# Patient Record
Sex: Male | Born: 1945 | Race: White | Hispanic: No | Marital: Married
Health system: Southern US, Community
[De-identification: ages and names within clinical notes are randomized; demographics above are authoritative.]

## PROBLEM LIST (undated history)

## (undated) DIAGNOSIS — I421 Obstructive hypertrophic cardiomyopathy: Secondary | ICD-10-CM

## (undated) DIAGNOSIS — K5190 Ulcerative colitis, unspecified, without complications: Secondary | ICD-10-CM

## (undated) DIAGNOSIS — I10 Essential (primary) hypertension: Secondary | ICD-10-CM

## (undated) DIAGNOSIS — R55 Syncope and collapse: Secondary | ICD-10-CM

## (undated) DIAGNOSIS — I2510 Atherosclerotic heart disease of native coronary artery without angina pectoris: Secondary | ICD-10-CM

## (undated) HISTORY — PX: APPENDECTOMY: SHX54

## (undated) HISTORY — DX: Obstructive hypertrophic cardiomyopathy: I42.1

## (undated) HISTORY — DX: Essential (primary) hypertension: I10

## (undated) HISTORY — PX: CARDIAC DEFIBRILLATOR PLACEMENT: SHX171

## (undated) HISTORY — PX: CHOLECYSTECTOMY: SHX55

## (undated) HISTORY — DX: Ulcerative colitis, unspecified, without complications: K51.90

## (undated) HISTORY — DX: Atherosclerotic heart disease of native coronary artery without angina pectoris: I25.10

## (undated) HISTORY — PX: ROTATOR CUFF REPAIR: SHX139

## (undated) HISTORY — DX: Syncope and collapse: R55

---

## 1971-08-12 HISTORY — PX: ILEOSTOMY: SHX1783

## 1971-08-12 HISTORY — PX: TOTAL COLECTOMY: SHX852

## 1986-08-11 HISTORY — PX: SMALL INTESTINE SURGERY: SHX150

## 2001-12-17 ENCOUNTER — Ambulatory Visit (HOSPITAL_COMMUNITY): Admission: RE | Admit: 2001-12-17 | Discharge: 2001-12-18 | Payer: Self-pay | Admitting: Internal Medicine

## 2001-12-18 ENCOUNTER — Encounter: Payer: Self-pay | Admitting: Internal Medicine

## 2004-09-05 ENCOUNTER — Ambulatory Visit: Payer: Self-pay | Admitting: Internal Medicine

## 2009-12-25 DIAGNOSIS — K5190 Ulcerative colitis, unspecified, without complications: Secondary | ICD-10-CM | POA: Insufficient documentation

## 2009-12-25 DIAGNOSIS — Z8669 Personal history of other diseases of the nervous system and sense organs: Secondary | ICD-10-CM

## 2009-12-25 DIAGNOSIS — I2510 Atherosclerotic heart disease of native coronary artery without angina pectoris: Secondary | ICD-10-CM | POA: Insufficient documentation

## 2009-12-25 DIAGNOSIS — I10 Essential (primary) hypertension: Secondary | ICD-10-CM | POA: Insufficient documentation

## 2009-12-26 ENCOUNTER — Ambulatory Visit: Payer: Self-pay | Admitting: Internal Medicine

## 2009-12-26 DIAGNOSIS — Z95810 Presence of automatic (implantable) cardiac defibrillator: Secondary | ICD-10-CM | POA: Insufficient documentation

## 2009-12-26 DIAGNOSIS — I424 Endocardial fibroelastosis: Secondary | ICD-10-CM | POA: Insufficient documentation

## 2009-12-26 DIAGNOSIS — R55 Syncope and collapse: Secondary | ICD-10-CM | POA: Insufficient documentation

## 2009-12-27 ENCOUNTER — Encounter: Payer: Self-pay | Admitting: Internal Medicine

## 2009-12-31 ENCOUNTER — Encounter: Payer: Self-pay | Admitting: Internal Medicine

## 2009-12-31 ENCOUNTER — Ambulatory Visit (HOSPITAL_COMMUNITY): Admission: RE | Admit: 2009-12-31 | Discharge: 2009-12-31 | Payer: Self-pay | Admitting: Internal Medicine

## 2010-01-01 LAB — CONVERTED CEMR LAB
BUN: 20 mg/dL (ref 6–23)
Basophils Absolute: 0 10*3/uL (ref 0.0–0.1)
Basophils Relative: 0 % (ref 0–1)
CO2: 23 meq/L (ref 19–32)
Calcium: 9.3 mg/dL (ref 8.4–10.5)
Chloride: 101 meq/L (ref 96–112)
Creatinine, Ser: 1.15 mg/dL (ref 0.40–1.50)
Eosinophils Absolute: 0.3 10*3/uL (ref 0.0–0.7)
Eosinophils Relative: 3 % (ref 0–5)
Glucose, Bld: 126 mg/dL — ABNORMAL HIGH (ref 70–99)
HCT: 43.1 % (ref 39.0–52.0)
Hemoglobin: 14.8 g/dL (ref 13.0–17.0)
INR: 1.27 (ref <1.50)
Lymphocytes Relative: 18 % (ref 12–46)
Lymphs Abs: 1.9 10*3/uL (ref 0.7–4.0)
MCHC: 34.3 g/dL (ref 30.0–36.0)
MCV: 93.5 fL (ref 78.0–100.0)
Monocytes Absolute: 1 10*3/uL (ref 0.1–1.0)
Monocytes Relative: 10 % (ref 3–12)
Neutro Abs: 7 10*3/uL (ref 1.7–7.7)
Neutrophils Relative %: 69 % (ref 43–77)
Platelets: 194 10*3/uL (ref 150–400)
Potassium: 4.1 meq/L (ref 3.5–5.3)
Prothrombin Time: 15.8 s — ABNORMAL HIGH (ref 11.6–15.2)
RBC: 4.61 M/uL (ref 4.22–5.81)
RDW: 14.2 % (ref 11.5–15.5)
Sodium: 135 meq/L (ref 135–145)
WBC: 10.2 10*3/uL (ref 4.0–10.5)
aPTT: 35 s (ref 24–37)

## 2010-01-04 ENCOUNTER — Ambulatory Visit: Payer: Self-pay | Admitting: Internal Medicine

## 2010-01-04 ENCOUNTER — Ambulatory Visit (HOSPITAL_COMMUNITY): Admission: RE | Admit: 2010-01-04 | Discharge: 2010-01-04 | Payer: Self-pay | Admitting: Internal Medicine

## 2010-01-11 ENCOUNTER — Encounter: Payer: Self-pay | Admitting: Internal Medicine

## 2010-01-21 ENCOUNTER — Ambulatory Visit: Payer: Self-pay

## 2010-01-21 ENCOUNTER — Encounter: Payer: Self-pay | Admitting: Internal Medicine

## 2010-04-11 ENCOUNTER — Ambulatory Visit: Payer: Self-pay | Admitting: Internal Medicine

## 2010-07-12 ENCOUNTER — Encounter: Payer: Self-pay | Admitting: Internal Medicine

## 2010-09-10 NOTE — Miscellaneous (Signed)
Summary: Device preload  Clinical Lists Changes  Observations: Added new observation of ICD INDICATN: CIM (01/11/2010 7:51) Added new observation of ICDLEADSTAT1: active (01/11/2010 7:51) Added new observation of ICDLEADSER1: [id number]  (01/11/2010 7:51) Added new observation of ICDLEADMOD1: 0158  (01/11/2010 7:51) Added new observation of ICDLEADLOC1: RV  (01/11/2010 7:51) Added new observation of ICD IMP MD: Lewayne Bunting, MD  (01/11/2010 7:51) Added new observation of ICDLEADDOI1: 01/04/2010  (01/11/2010 7:51) Added new observation of ICD IMPL DTE: 01/04/2010  (01/11/2010 7:51) Added new observation of ICD SERL#: [id number]  (01/11/2010 7:51) Added new observation of ICD MODL#: E102  (01/11/2010 7:51) Added new observation of ICDMANUFACTR: [employer]  (01/11/2010 7:51) Added new observation of ICD MD: Lewayne Bunting, MD  (01/11/2010 7:51)       ICD Specifications Following MD:  Lewayne Bunting, MD     ICD Vendor:  [facility] [employer]     ICD Model Number:  E102     ICD Serial Number:  [id number] ICD DOI:  01/04/2010     ICD Implanting MD:  Lewayne Bunting, MD  Lead 1:    Location: RV     DOI: 01/04/2010     Model #: [id number]     Serial #: [id number]     Status: active  Indications::  CIM   Huston Foley Parameters Mode VVIR     Lower Rate Limit:  40      Tachy Zones VF:  210     VT:  170

## 2010-09-10 NOTE — Cardiovascular Report (Signed)
Summary: Office Visit   Office Visit   Imported By: Roderic Ovens 01/11/2010 10:34:28  _____________________________________________________________________  External Attachment:    Type:   Image     Comment:   External Document

## 2010-09-10 NOTE — Letter (Signed)
Summary: Implantable Device Instructions  [facility] [place]  [address] [place] [address]   Phone: [phone]  Fax: [phone]      Implantable Device Instructions  You are scheduled for:  _____ Permanent Transvenous Pacemaker _____ Implantable Cardioverter Defibrillator _____ Implantable Loop Recorder __X___ Generator Change  on __May 27, 2011___ with Dr. __Taylor___.  1.  Please arrive at the Short Stay Center at [facility] at __12:30pm___ on the day of your procedure.  2.  Do not eat or drink the night before your procedure.  3.  Complete lab work on _May 23, 2011____.    4.  Do NOT take these medications for ____ days prior to your procedure:  _________________________.  Take your last dose of Coumadin on ________.  5.  Plan for an overnight stay.  Bring your insurance cards and a list of your medications.  6.  Wash your chest and neck with antibacterial soap (any brand) the evening before and the morning of your procedure.  Rinse well.  7.  Education material received:     Pacemaker _____           ICD _____           Arrhythmia _____  *If you have ANY questions after you get home, please call the office [phone].  *Every attempt is made to prevent procedures from being rescheduled.  Due to the nauture of Electrophysiology, rescheduling can happen.  The physician is always aware and directs the staff when this occurs.

## 2010-09-10 NOTE — Assessment & Plan Note (Signed)
Summary: defib check.gdt.amber   Visit Type:  Follow-up Primary Provider:  DR.GARY MILLER  CC:  NO CARDIOLOGY COMPLAINTS TODAY.  History of Present Illness: [name] returns today for followup.  We have not seen him for many years.  He has a h/o HCM and syncope and underwent ICD implant 8 yrs ago.  He has noticed that over the past two weeks he has heard his ICD beeping.  He denies any intercurrent ICD therapies and has not had any syncopal episodes.  No other complaints today.  Current Medications (verified): 1)  Metoprolol Tartrate 25 Mg Tabs (Metoprolol Tartrate) .... Take 1 Tab Daily 2)  Zantac 150 Mg Tabs (Ranitidine Hcl) .... Take 1 Tab Two Times A Day 3)  Paxil 10 Mg Tabs (Paroxetine Hcl) .... Take 1 Tab Daily 4)  Aspir-Low 81 Mg Tbec (Aspirin) .... Take 1 Tab Daily 5)  Lopid 600 Mg Tabs (Gemfibrozil) .... Take 1 Tab Daily 6)  Zantac 150 Mg Tabs (Ranitidine Hcl) .... Take 1 Tab Two Times A Day 7)  Paxil 20 Mg Tabs (Paroxetine Hcl) .... Take 1 Tab Daily 8)  Ambien 10 Mg Tabs (Zolpidem Tartrate) .... Take 1 Tab Daily 9)  Aspir-Low 81 Mg Tbec (Aspirin) .... Take 1 Tab Daily 10)  Daily Multi  Tabs (Multiple Vitamins-Minerals) .... Take 1 Tab Daily 11)  Folic Acid 1 Mg Tabs (Folic Acid) .... Take 1 Tab Daily  Allergies (verified): 1)  ! 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    Vital Signs:  Patient profile:   65 year old male Height:      68  inches Weight:      131 pounds BMI:     19.99 Pulse rate:   51 / minute BP sitting:   99 / 63  (right arm)  Vitals Entered By: Dreama Saa, CNA (Dec 26, 2009 4:13 PM)  Physical Exam  General:  Well developed, well nourished, in no acute distress.  HEENT: normal Neck: supple. No JVD. Carotids 2+ bilaterally no bruits Cor: RRR no rubs, gallops or murmur Lungs: CTA Ab: soft, nontender. nondistended. No HSM. Good bowel sounds. Scaphoid. Ext: warm. no cyanosis, clubbing or edema Neuro: alert and oriented. Grossly nonfocal. affect pleasant     ICD Follow Up Remote Check?  No Battery Voltage:  2.50 V     Charge Time:  14.6 seconds     Battery Est. Longevity:  ERI Underlying rhythm:  SR   ICD Device Measurements Right Ventricle:  Amplitude: 15 mV, Impedance: 511 ohms, Threshold: 0.8 V at 0.5 msec Shock Impedance: 52 ohms   Episodes Shock:  0     ATP:  0     Nonsustained:  0     Ventricular Pacing:  1%  Brady Parameters Mode VVIR     Lower Rate Limit:  40      Tachy Zones VF:  210     VT:  170     Tech Comments:  Battery @ [facility] 5/11.   Altha Harm, LPN  Dec 26, 2009 5:03 PM  MD Comments:  Agree with above.  Impression &  Recommendations:  Problem # 1:  AUTOMATIC IMPLANTABLE CARDIAC DEFIBRILLATOR SITU (ICD-V45.02) His device is at [facility].  Will schedule ICD generator change.  Problem # 2:  SYNCOPE AND COLLAPSE (ICD-780.2) Since his ICD implant, he has had no additional syncope. His updated medication list for this problem includes:    Metoprolol Tartrate 25 Mg Tabs (Metoprolol tartrate) .Marland Kitchen... Take 1 tab daily    Aspir-low 81 Mg Tbec (Aspirin) .Marland Kitchen... Take 1 tab daily  Orders: T-Chest x-ray, 2 views (71020) Bi-V ICD (Bi-V ICD)Future Orders: T-Basic Metabolic Panel [phone]) ... 12/31/2009 T-CBC w/Diff ([id number]) ... 12/31/2009 T-PTT ([id number]) ... 12/31/2009 T-Protime, Auto ([id number]) ... 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  Will followup.  Patient Instructions: 1)  Your physician recommends that you schedule a follow-up appointment in: post procedure 2)  Your physician recommends that you return for lab work in: Dec 31, 2009 3)  A chest x-ray takes a picture of the organs and structures inside the chest, including the heart, lungs, and blood vessels. This test can show several things, including, whether the heart is enlarged; whether fluid is building up in the lungs; and whether pacemaker / defibrillator leads are still in place. 4)  Your physician has recommended that you have a defibrillator inserted.  An implantable cardioverter defibrillator (ICD) is a small device that is placed in your chest or, in rare cases, your abdomen. This device uses electrical pulses or shocks to help control life-threatening, irregular heartbeats that could lead the heart to suddenly stop beating (sudden cardiac arrest). Leads are attached to the ICD that goes into your heart. This is done in the hospital and usually requires an overnight stay. Please see the instruction sheet given to you today for more information.

## 2010-09-10 NOTE — Cardiovascular Report (Signed)
Summary: Office Visit   Office Visit   Imported By: Roderic Ovens 02/07/2010 12:41:03  _____________________________________________________________________  External Attachment:    Type:   Image     Comment:   External Document

## 2010-09-10 NOTE — Assessment & Plan Note (Signed)
Summary: 3 mth f/u per checkout on 01/21/10 per Jasmine December in [place]/tg   Visit Type:  Follow-up Primary Provider:  DR.GARY MILLER  CC:  no cardiology complaints.  History of Present Illness: Mark Castro returns today for followup.  He is a pleasant middle aged man with a DCM and syncope, Class 1 CHF, who underwent ICD generator change several months ago.  He has done well since then.  No c/p, sob or peripheral edema.  No intercurrent ICD therapies.  Current Medications (verified): 1)  Metoprolol Tartrate 25 Mg Tabs (Metoprolol Tartrate) .... Take 1 Tab Daily 2)  Zantac 150 Mg Tabs (Ranitidine Hcl) .... Take 1 Tab Two Times A Day 3)  Paxil 10 Mg Tabs (Paroxetine Hcl) .... Take 1 Tab Daily 4)  Aspir-Low 81 Mg Tbec (Aspirin) .... Take 1 Tab Daily 5)  Lopid 600 Mg Tabs (Gemfibrozil) .... Take 1 Tab Daily 6)  Ambien 10 Mg Tabs (Zolpidem Tartrate) .... Take 1 Tab Daily 7)  Daily Multi  Tabs (Multiple Vitamins-Minerals) .... Take 1 Tab Daily 8)  Folic Acid 1 Mg Tabs (Folic Acid) .... Take 1 Tab Daily  Allergies (verified): 1)  ! 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    Vital Signs:  Patient profile:   65 year old male Weight:      131 pounds BMI:     19.99 Pulse rate:   46 / minute BP sitting:   103 / 64  (right arm)  Vitals Entered By: Dreama Saa, CNA (April 11, 2010 8:16 AM)  Physical Exam  General:  Well developed, well nourished, in no acute  distress.  HEENT: normal Neck: supple. No JVD. Carotids 2+ bilaterally no bruits Cor: RRR no rubs, gallops or murmur Lungs: CTA. Well healed ICD incision. Ab: soft, nontender. nondistended. No HSM. Good bowel sounds. Scaphoid. Ext: warm. no cyanosis, clubbing or edema Neuro: alert and oriented. Grossly nonfocal. affect pleasant     ICD Specifications Following MD:  Mark Bunting, MD     ICD Vendor:  [facility] [employer]     ICD Model Number:  E102     ICD Serial Number:  [id number] ICD DOI:  01/04/2010     ICD Implanting MD:  Mark Bunting, MD  Lead 1:    Location: RV     DOI: 01/04/2010     Model #: [id number]     Serial #: [id number]     Status: active  Indications::  ICM   ICD Follow Up Remote Check?  No Charge Time:  8.5 seconds     Battery Est. Longevity:  9 years Underlying rhythm:  SR ICD Dependent:  No       ICD Device Measurements Right Ventricle:  Amplitude: 19.3 mV, Impedance: 446 ohms, Threshold: 0.8 V at 0.4 msec Shock Impedance: 59 ohms   Episodes Coumadin:  No Shock:  0     ATP:  0     Nonsustained:  11     Ventricular Pacing:  7%  Brady Parameters Mode VVI     Lower Rate Limit:  40      Tachy Zones  VF:  210     VT:  170     Next Remote Date:  07/11/2010     Tech Comments:  No parameter changes.  Device function normal.  Latitude transmissions every 3 months, ROV 1 year with Dr. Ladona Ridgel in [place]. Altha Harm, LPN  April 11, 2010 8:33 AM  MD Comments:  Agree with above.  Impression & Recommendations:  Problem # 1:  AUTOMATIC IMPLANTABLE CARDIAC DEFIBRILLATOR SITU (ICD-V45.02) HIs device is working normally.  I have asked him to follow up as needed.  He will return to Dr. Rondel Baton care and I will be available as needed.  Problem # 2:  HYPERTENSION (ICD-401.9) His blood pressure is well controlled.  He will continue his current meds and maintain a low sodium diet. His updated medication list for this problem includes:    Metoprolol Tartrate 25 Mg Tabs (Metoprolol  tartrate) .Marland Kitchen... Take 1 tab daily    Aspir-low 81 Mg Tbec (Aspirin) .Marland Kitchen... 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  His updated medication list for this problem includes:    Metoprolol Tartrate 25 Mg Tabs (Metoprolol tartrate) .Marland Kitchen... Take 1 tab daily    Aspir-low 81 Mg Tbec (Aspirin) .Marland Kitchen... Take 1 tab daily  Patient Instructions: 1)  Your physician recommends that you schedule a follow-up appointment in: as needed  2)  Your physician recommends that you continue on your current medications as directed. Please refer to the Current Medication list given to you today.

## 2010-09-10 NOTE — Letter (Signed)
Summary: Device-Delinquent Phone Journalist, newspaper, Main Office  [address]   [place], [place] [address]   Phone: [phone]  Fax: [phone]     July 12, 2010 MRN: [id number]   Mark Castro [address] [place], [place]  [address]   Dear [name],  According to our records, you were scheduled for a device phone transmission on  07-11-2010.     We did not receive any results from this check.  If you transmitted on your scheduled day, please call us to help troubleshoot your system.  If you forgot to send your transmission, please send one upon receipt of this letter.  Thank you,   Architectural technologist [facility]

## 2010-09-10 NOTE — Procedures (Signed)
Summary: [facility] per pt call/lg   Current Medications (verified): 1)  Metoprolol Tartrate 25 Mg Tabs (Metoprolol Tartrate) .... Take 1 Tab Daily 2)  Zantac 150 Mg Tabs (Ranitidine Hcl) .... Take 1 Tab Two Times A Day 3)  Paxil 10 Mg Tabs (Paroxetine Hcl) .... Take 1 Tab Daily 4)  Aspir-Low 81 Mg Tbec (Aspirin) .... Take 1 Tab Daily 5)  Lopid 600 Mg Tabs (Gemfibrozil) .... Take 1 Tab Daily 6)  Paxil 20 Mg Tabs (Paroxetine Hcl) .... Take 1 Tab Daily 7)  Ambien 10 Mg Tabs (Zolpidem Tartrate) .... Take 1 Tab Daily 8)  Daily Multi  Tabs (Multiple Vitamins-Minerals) .... Take 1 Tab Daily 9)  Folic Acid 1 Mg Tabs (Folic Acid) .... Take 1 Tab Daily  Allergies (verified): 1)  ! Penicillin   ICD Specifications Following MD:  Lewayne Bunting, MD     ICD Vendor:  [facility] [employer]     ICD Model Number:  E102     ICD Serial Number:  [id number] ICD DOI:  01/04/2010     ICD Implanting MD:  Lewayne Bunting, MD  Lead 1:    Location: RV     DOI: 01/04/2010     Model #: 0454     Serial #: [id number]     Status: active  Indications::  CIM   ICD Follow Up Remote Check?  No Battery Voltage:  good V     Charge Time:  8.2 seconds     Battery Est. Longevity:  9 years Underlying rhythm:  Brady @ 51 ICD Dependent:  No       ICD Device Measurements Right Ventricle:  Amplitude: 14.7 mV, Impedance: 453 ohms, Threshold: 0.9 V at 0.4 msec Shock Impedance: 56 ohms   Episodes Shock:  0     ATP:  0     Nonsustained:  2      Brady Parameters Mode VVIR     Lower Rate Limit:  40      Tachy Zones VF:  210     VT:  170     Next Cardiology Appt Due:  04/11/2010 Tech Comments:  Steri strips removed.  No  redness or edema noted.  No parameter changes.  ROV 3 months with Dr. Ladona Ridgel in [place].  Altha Harm, LPN  January 21, 2010 9:54 AM

## 2010-10-28 LAB — SURGICAL PCR SCREEN
MRSA, PCR: NEGATIVE
Staphylococcus aureus: NEGATIVE

## 2010-12-27 NOTE — Procedures (Signed)
[place]  Patient:    Mark Castro, Mark Castro Visit Number: [id number] MRN: [id number]          Service Type: CAT Location: 3700 [id number] Attending Physician:  Lewayne Bunting Dictated by:   Doylene Canning. Ladona Ridgel, M.D. [facility] Proc. Date: 12/17/01 Admit Date:  12/17/2001 Discharge Date: 12/18/2001   CC:         Dr. Charlett Blake, [place], [place]  Kathrine Cords   Procedure Report  PROCEDURE:  Implantation of an implantable cardioverter-defibrillator.  INTRODUCTION:  The patient is a very pleasant 65 year old man who has had a diagnosis of hypertrophic cardiomyopathy for approximately three years.  He has a history of nonsustained VT.  He has been on beta blockers.  He was in his usual state of health until approximately two weeks ago, when he was sitting on his sofa watching television.  He developed tachypalpitations and passed out.  His wife, who witnessed the event, states that he was totally unresponsive for approximately 10 seconds, and she shook the patient and yelled at him and he awoke.  After that the patient felt weak but had no other symptoms and presented to the emergency room, where he was evaluated and no abnormalities were found.  He is referred for additional evaluation.  The patient denies any other history of syncope other than the one that was described.  His family history is notable for his mother dying suddenly also with hypertrophic cardiomyopathy.  He is now referred for ICD implantation secondary to the risk of severe malignant ventricular arrhythmias in the setting of hypertrophic cardiomyopathy.  Of note, his LV wall thickness by echo was thought to be approximately 23 mm.  DESCRIPTION OF PROCEDURE:  After informed consent was obtained, the patient was taken to the diagnostic EP lab in a fasted state.  After the usual preparation and draping, intravenous fentanyl and midazolam were given for sedation.  A total of 30 cc of lidocaine was  infiltrated into the left infraclavicular region.  Electrocautery was utilized to dissect down to the subpectoralis fascia.  Ten cubic centimeters of contrast demonstrated a patent left subclavian vein.  It was subsequently punctured and the Guidant model 0158, active-fixation defibrillation lead was placed in the right ventricle. Serial number on the lead was [id number].  With the lead in satisfactory position, it was secured to the muscle with active fixation.  At this point the R-waves measured 10 millivolts and the pacing impedance was 600 Ohms with a pacing threshold of 0.7 volts at 0.5 msec.  The lead was secured to the subpectoralis fascia with a figure-of-eight silk suture, and the sewing sleeves were also secured with silk suture.  Electrocautery was then utilized to make a subcutaneous pocket.  The Guidant Prizm II-VR AICD, model [id number], serial number [id number], was then connected to the defibrillation lead and placed in the subcutaneous pocket.  The generator was secured with a silk suture, and defibrillation threshold testing was carried out.  After the patient was more deeply sedated with intravenous fentanyl and midazolam, the first DFT test was carried out.  A T-wave shock was subsequently delivered, and 14 joules terminated VF and restored sinus rhythm.  Five minutes were allowed to elapse, and a second DFT test was carried out. Again a 14-joule shock was utilized and VF was induced with a T-wave shock. Fourteen joules failed to terminate VF, and the device charged to 21 joules and it resulted in a dirty break back to sinus rhythm.  At  this point five additional minutes were allowed to elapse, and a final DFT test was carried out.  Again VF was induced with a T-wave shock and a 21-joule shock terminated VF and restored sinus rhythm.  At this point no additional DFT testing was carried out, and the incision was closed with a layer of 2-0 Vicryl, followed by a layer of 3-0 Vicryl,  followed by a layer of 4-0 Vicryl.  Benzoin was painted on the skin and Steri-Strips were applied and a pressure dressing placed, and the patient returned to his room in satisfactory condition.  COMPLICATIONS:  There were no immediate procedural complications.  RESULTS:  This demonstrates successful implantation of a Guidant single-chamber defibrillator in a patient with syncope and hypertrophic cardiomyopathy. Dictated by:   Doylene Canning. Ladona Ridgel, M.D. [facility] Attending Physician:  Lewayne Bunting DD:  12/17/01 TD:  12/20/01 Job: [id number]

## 2010-12-27 NOTE — Discharge Summary (Signed)
Mark Castro  Patient:    Mark Castro Visit Number: [id number] MRN: [id number]          Service Type: CAT Location: 3700 3715 01 Attending Physician:  Mark Castro Dictated by:   Mark Castro, P.A. Admit Date:  12/17/2001 Disc. Date: 12/18/01   CC:         Mark Castro in [place], [place]   Referring Physician Discharge [place]  DATE OF BIRTH:  1946-04-19  PROCEDURES:  Insertion of intracardiac defibrillator.  Castro COURSE:  Mark Castro is a 65 year old male with a past history of nonobstructive coronary artery disease and hypertrophic obstructive cardiomyopathy who felt a racing in his chest one night and had sudden loss of consciousness witnessed by his wife.  He has had presyncopal episodes for years that were helped with Toprol-XL.  He was evaluated and it was determined that an ICD was indicated and he was admitted for this on Dec 17, 2001.  He had a Guidant Ventak Prism AICD inserted on Dec 17, 2001 through the left subclavian.  It was tested and functioning well with acceptable pacing and sensing.  His DFT was less than or equal to 215.  The night after the ICD was inserted he had 14 beats of ventricular tachycardia.  He was asymptomatic with this and he was actually asleep at the time it happened.  He was not aware of any palpitations and his vital signs were stable.  The patient had a postprocedure ICD check that was within normal limits and his postprocedure chest x-ray showed some cardiac enlargement but no acute disease and no pneumothorax.  His medications were evaluated that it was felt that with a systolic blood pressure of 110 and a heart rate of 60 no advances could be made on his beta blockers at this time.  No medication changes were made and the patient was considered stable for discharge on Dec 18, 2001.  LABORATORY VALUES:  Hemoglobin 15.6, hematocrit 44.2, wbcs 8.3, platelets 130.  INR 1.2, PTT  33.  Chest x-ray:  Cardiac enlargement, borderline, with no acute disease; AICD leads in position.  DISCHARGE CONDITION:  Stable.  DISCHARGE DIAGNOSES:  1. Syncope, possibly secondary to ventricular tachycardia.  2. Hypertrophic obstructive cardiomyopathy.  3. Nonobstructive coronary artery disease by catheterization in 2001.  4. History of hypotension.  5. History of ulcerative colitis.  6. Status post ileostomy and total colectomy in 1973.  7. History of bowel obstruction in 1988.  8. Status post cholecystectomy and left rotator cuff repair.  9. Status post appendectomy. 10. History of allergy to PENICILLIN.  DISCHARGE INSTRUCTIONS:  ACTIVITY:  His activity level is to include no heavy lifting for six to eight weeks with the left arm.  He is not to raise his left arm above his head for one week and to gradually increase activity as advised.  DIET:  He is to stick to a low fat and cholesterol diet.  WOUND CARE:  He is not to get his wound wet for one week and Steri-Strips are not to be removed.  SPECIAL INSTRUCTIONS:  He is to do no driving for two weeks per the procedure but six months if that is the [place] guidelines.  FOLLOW-UP:  He is to get a wound check with the P.A. in the [place] office on Dec 31, 2001 at 10 a.m.  He is to follow up with Dr. Ladona Castro in three months and the office will call.  He is to call the office if his defibrillator fires x1 and he is okay but any increase in numbers or any symptoms post defibrillator firing, he is to call 911.  DISCHARGE MEDICATIONS:  1. Toprol-XL 25 mg b.i.d.  2. Zantac 150 mg b.i.d.  3. Paxil 10 mg q.d.  4. Vioxx 25 mg q.d.  5. Aspirin 81 mg q.d.  6. Vicodin t.i.d. p.r.n., #10 given and no refills. Dictated by:   Mark Castro, P.A. Attending Physician:  Mark Castro DD:  12/18/01 TD:  12/18/01 Job: [id number]

## 2011-08-06 ENCOUNTER — Encounter: Payer: Self-pay | Admitting: Internal Medicine

## 2011-08-06 DIAGNOSIS — I421 Obstructive hypertrophic cardiomyopathy: Secondary | ICD-10-CM | POA: Insufficient documentation

## 2011-12-13 IMAGING — CR DG CHEST 2V
2 series · 2 of 2 positions shown · non-contrast
Comparison: None.

CLINICAL DATA: Syncope and collapse.  Preop for pacemaker
replacement.

CHEST - 2 VIEW

[view not recorded (1 of 2)]
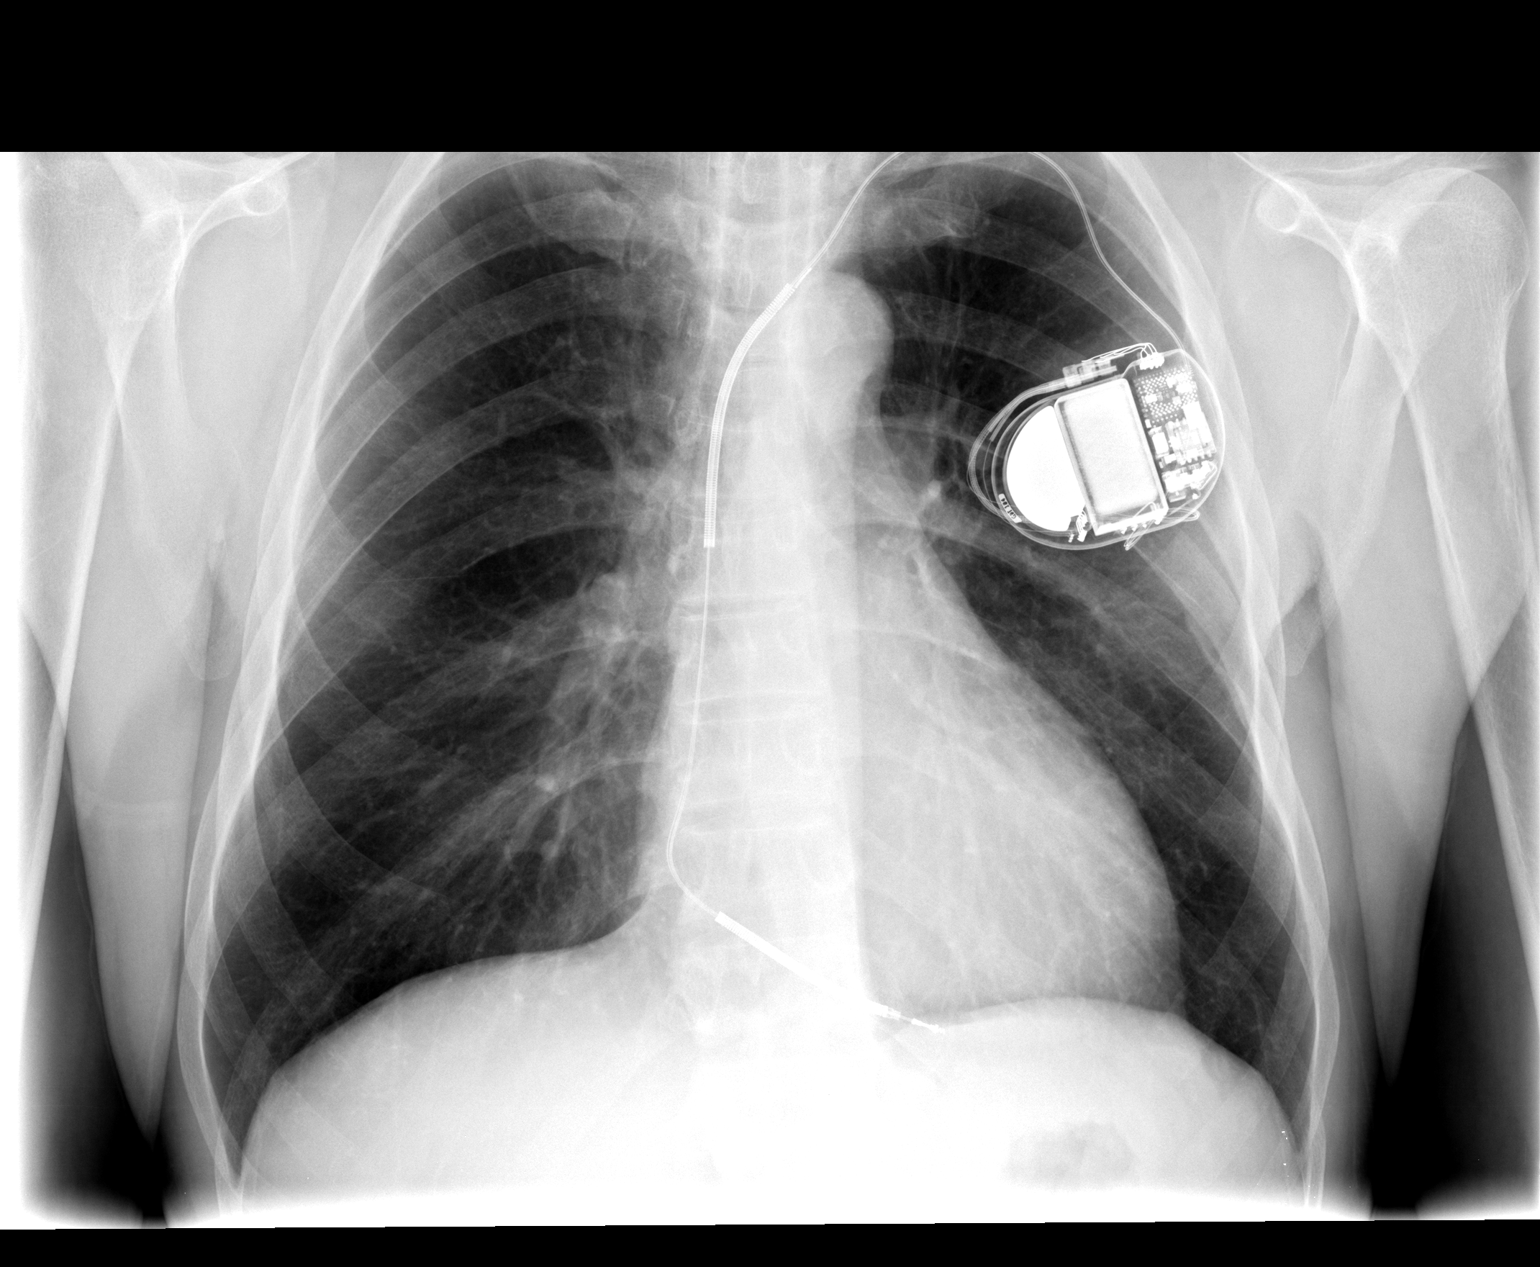

[view not recorded (2 of 2)]
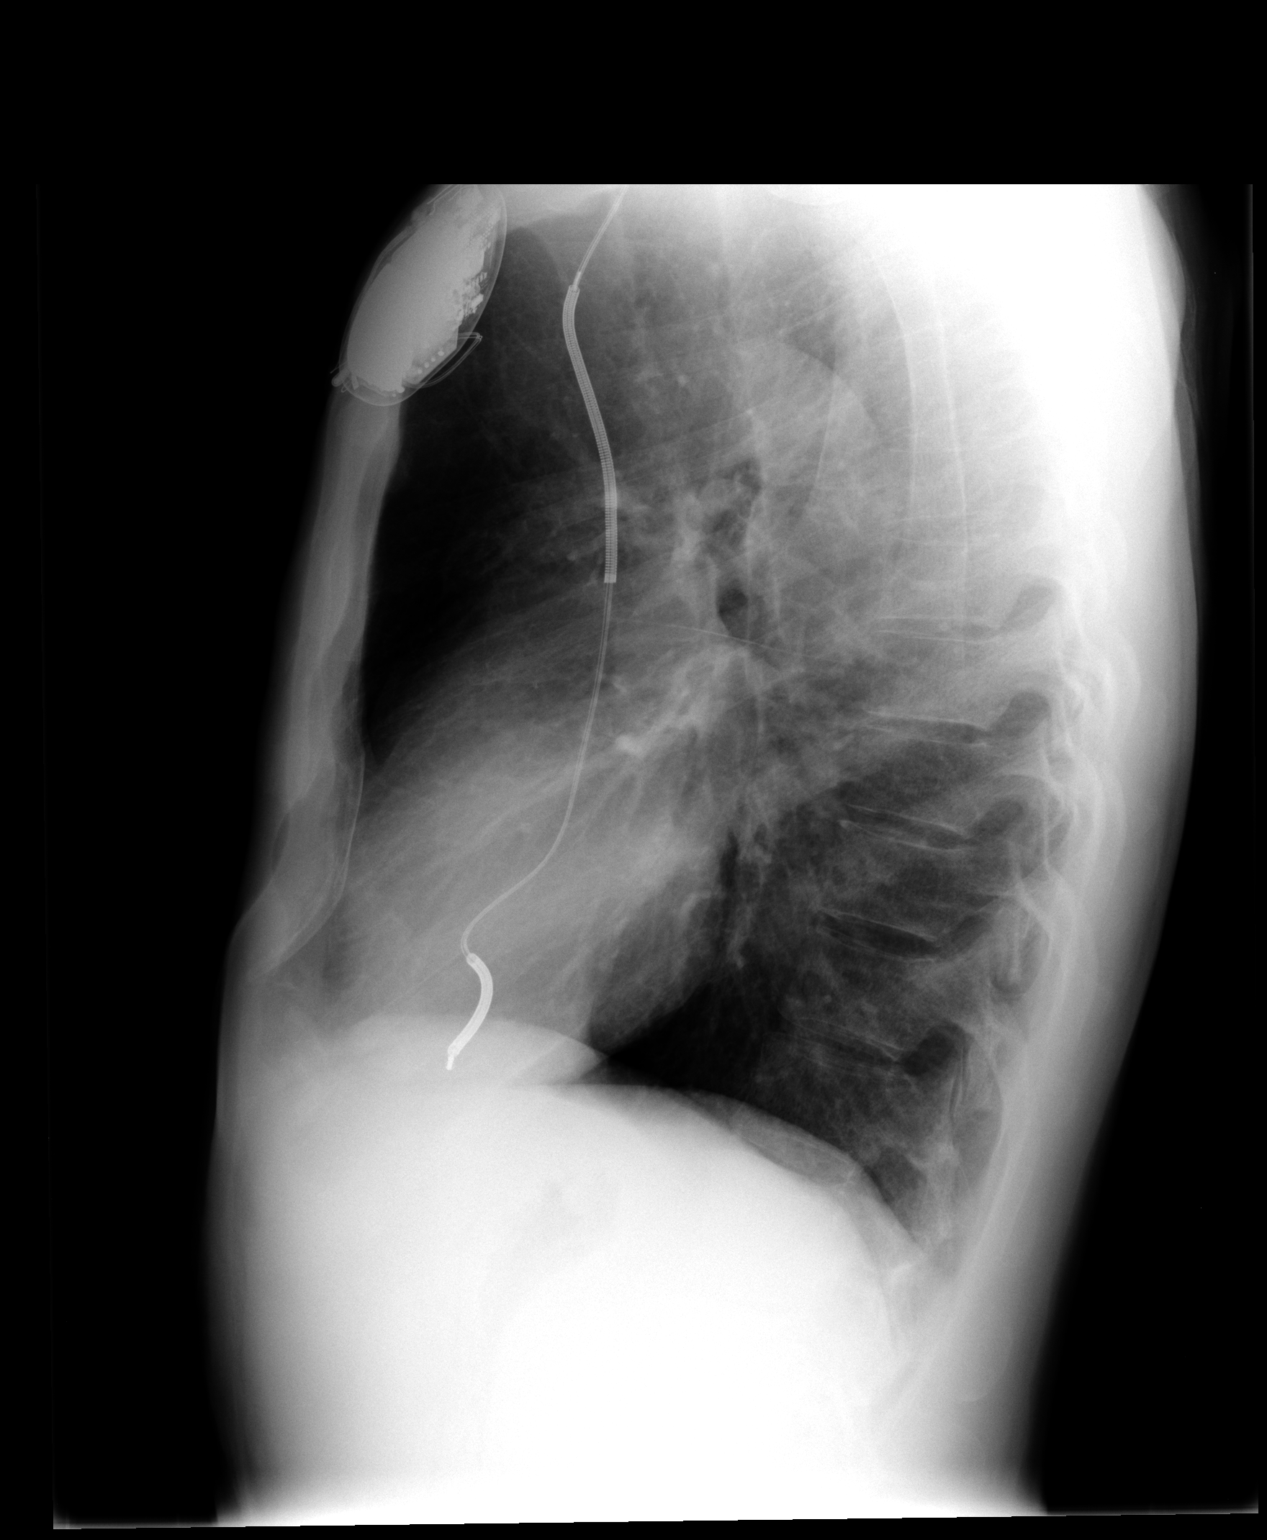

[2 of 2 positions shown; findings below may reference images not displayed]

FINDINGS: The cardiopericardial silhouette is normal in size.  A
single lead AICD is present.  The lungs are clear.  The visualized
soft tissues and bony thorax are unremarkable.
IMPRESSION: 1.  Single lead AICD.
2.  No acute abnormality.

## 2016-09-24 ENCOUNTER — Telehealth: Payer: Self-pay | Admitting: *Deleted

## 2016-09-24 NOTE — Telephone Encounter (Signed)
Spoke with MD calling from [facility].  Patient is scheduled for RF ablation of lumbar spine today.  MD wanted recommendations for perioperative device management.  Advised that patient has not been seen by our office since 2006 so we cannot make recommendations.  MD verbalizes understanding and states [facility] will [facility].

## 2021-08-11 DEATH — deceased
